# Patient Record
Sex: Male | Born: 1965 | Race: White | Hispanic: No | Marital: Single | Smoking: Current every day smoker
Health system: Southern US, Community
[De-identification: ages and names within clinical notes are randomized; demographics above are authoritative.]

## PROBLEM LIST (undated history)

## (undated) ENCOUNTER — Ambulatory Visit: Admission: EM | Payer: BLUE CROSS/BLUE SHIELD

## (undated) DIAGNOSIS — I10 Essential (primary) hypertension: Secondary | ICD-10-CM

## (undated) HISTORY — PX: ABDOMINAL SURGERY: SHX537

---

## 2011-01-13 ENCOUNTER — Other Ambulatory Visit: Payer: Self-pay | Admitting: Internal Medicine

## 2011-01-13 ENCOUNTER — Ambulatory Visit
Admission: RE | Admit: 2011-01-13 | Discharge: 2011-01-13 | Disposition: A | Discharge status: Home / Self Care | Payer: BC Managed Care – PPO | Source: Ambulatory Visit | Attending: Internal Medicine | Admitting: Internal Medicine

## 2011-01-13 DIAGNOSIS — R0781 Pleurodynia: Secondary | ICD-10-CM

## 2011-01-13 DIAGNOSIS — F17210 Nicotine dependence, cigarettes, uncomplicated: Secondary | ICD-10-CM

## 2012-06-10 IMAGING — CT CT CHEST W/O CM
2 of 3 series · 15 of 36 positions shown, 18 images · non-contrast
Comparison: None.

CLINICAL DATA: Smoking history, pleuritic chest pain

CT CHEST WITHOUT CONTRAST
TECHNIQUE: Multidetector CT imaging of the chest was performed
following the standard protocol without IV contrast.

[Series 2: routine chest · axial · 0.68mm/px · z∈[-242,-7]mm · 12 of 57 slices shown, 15 images]
[im 5/57  mediastinal]
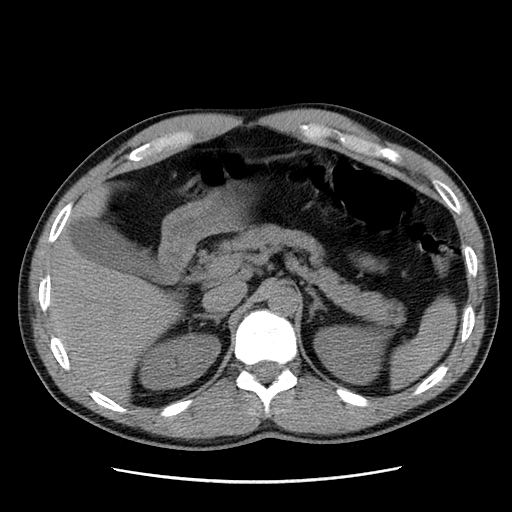
[im 5/57  lung]
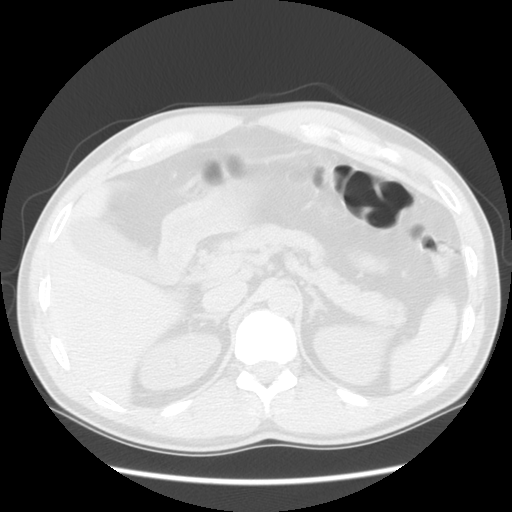
[im 9/57  lung]
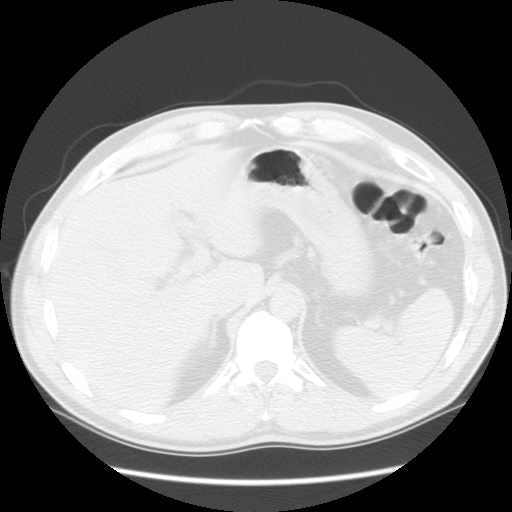
[im 13/57  lung]
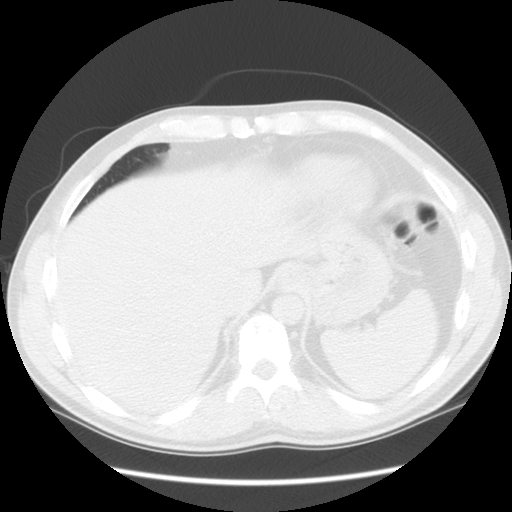
[im 17/57  lung]
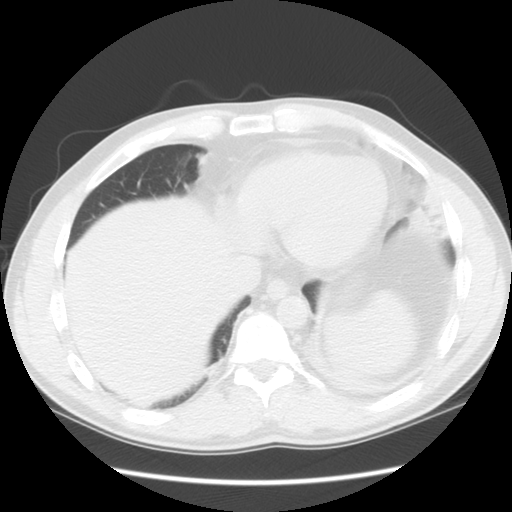
[im 21/57  mediastinal]
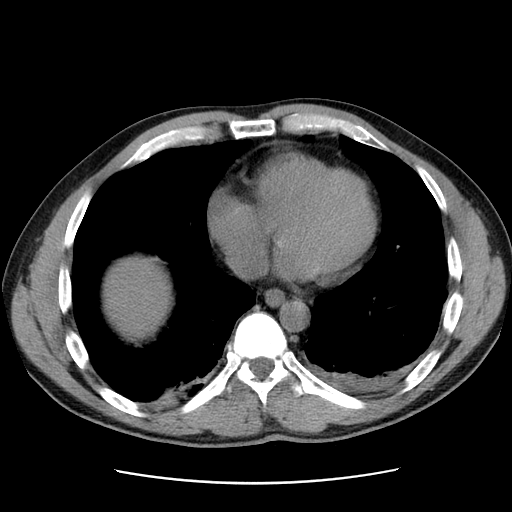
[im 21/57  lung]
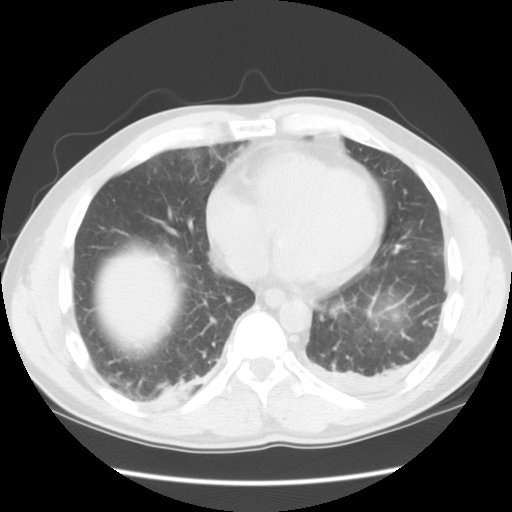
[im 25/57  lung]
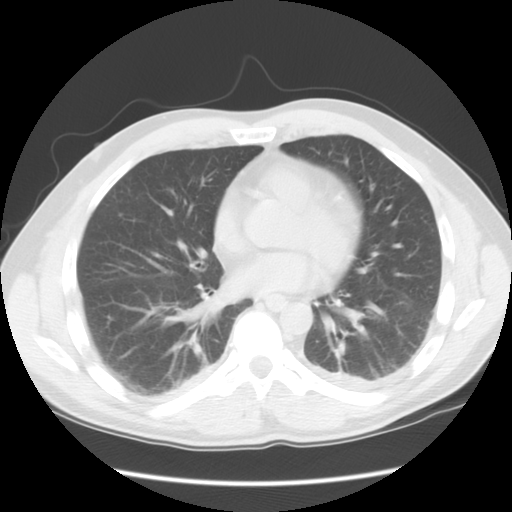
[im 32/57  lung]
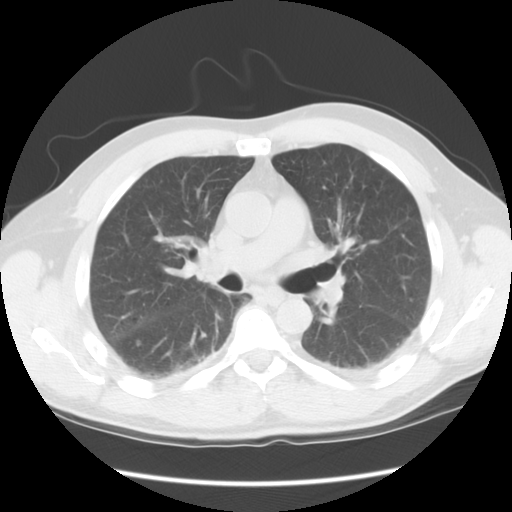
[im 36/57  lung]
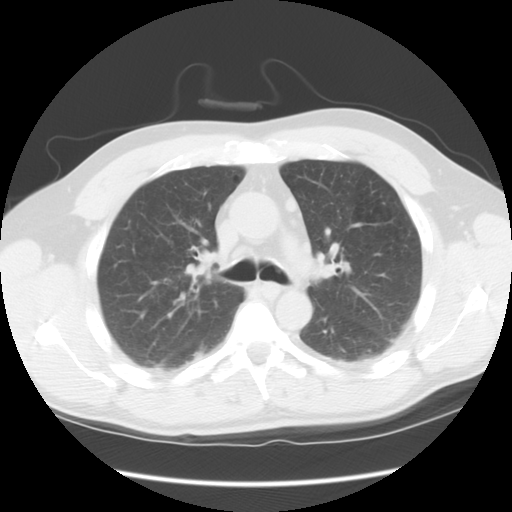
[im 40/57  mediastinal]
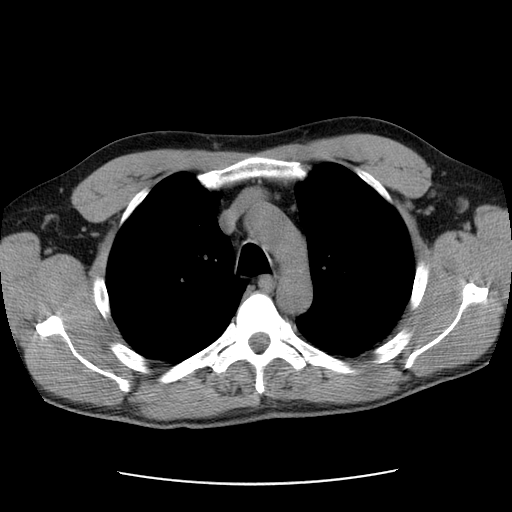
[im 40/57  lung]
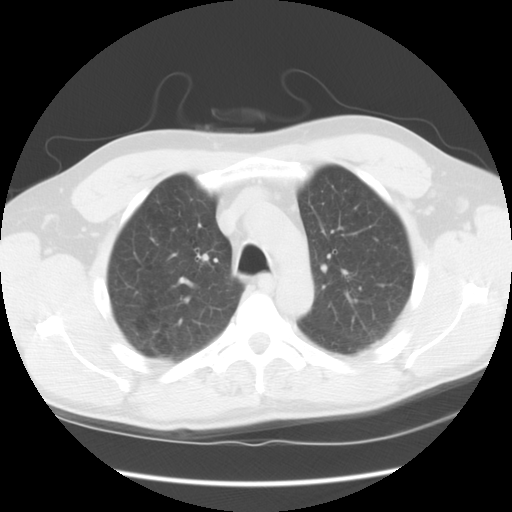
[im 44/57  lung]
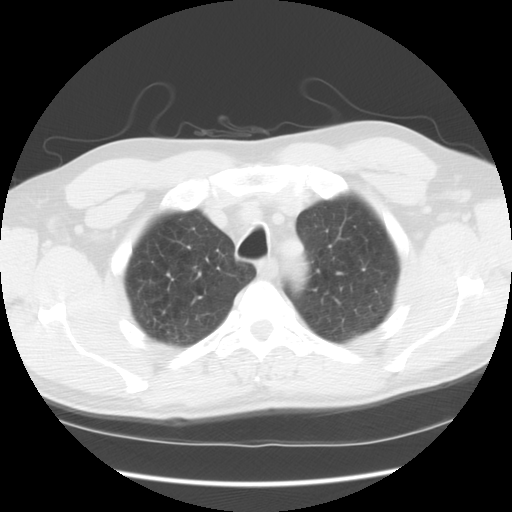
[im 48/57  lung]
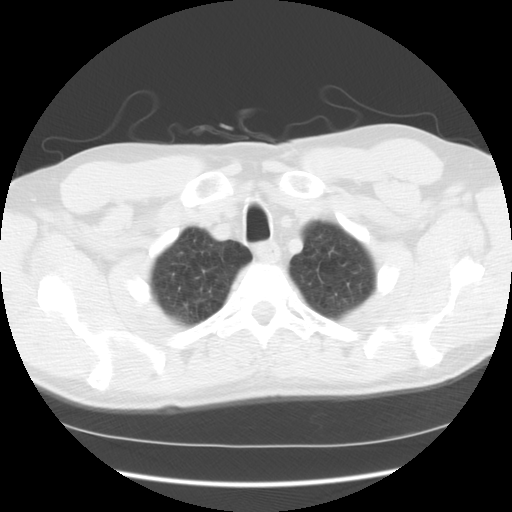
[im 52/57  lung]
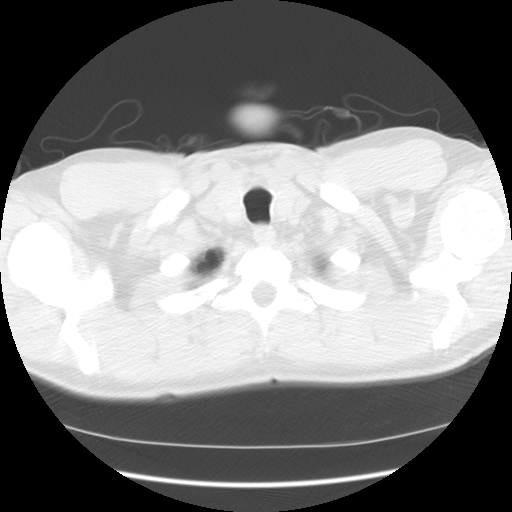

[Series 400: cor · coronal · 0.68mm/px · 3 of 106 slices shown]
[im 22/106  lung]
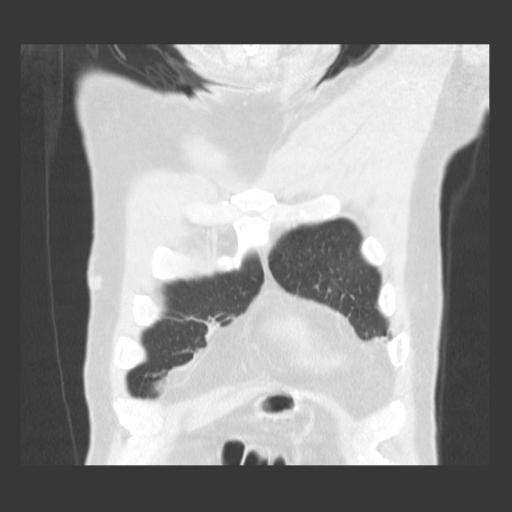
[im 43/106  lung]
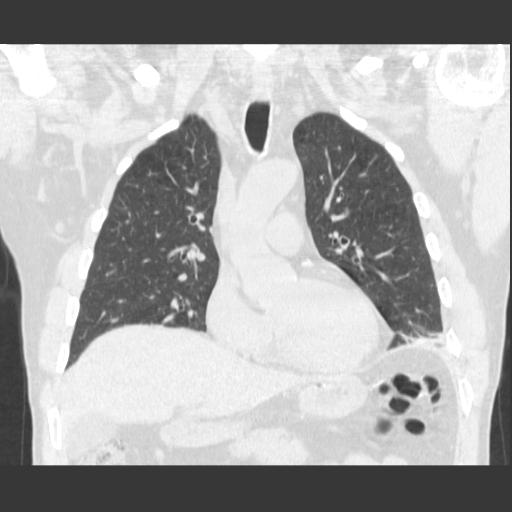
[im 64/106  lung]
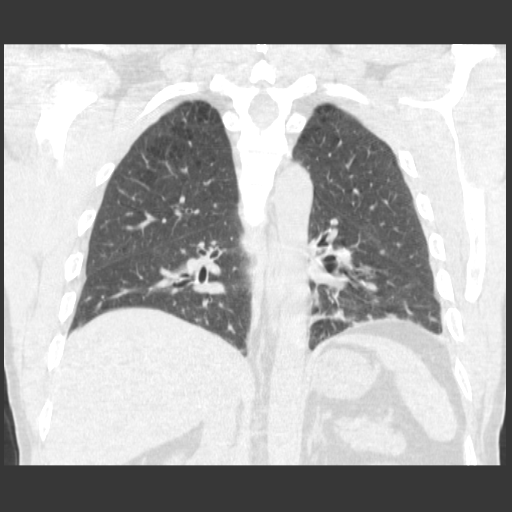

[15 of 36 positions shown; findings below may reference images not displayed]

FINDINGS: Moderately severe centrilobular emphysema is noted.
There are a few scattered lung nodules which appear noncalcified.
The largest nodule is in the left lower lobe measuring 5 mm.  These
nodules may be due to prior inflammatory or infectious process, but
metastatic involvement the lungs cannot be excluded.  No dominant
lung mass is seen and no pleural effusion is noted.

On soft tissue window images, the thyroid gland is unremarkable.
Only a few small mediastinal nodes are present.  There appears be a
coronary artery stent present.  Mild basilar atelectasis left
greater than right is noted.  No abnormality of the upper abdomen
is seen.

On the lung window images, there is a small polypoid lesion within
the orifice of the left mainstem bronchus measuring approximately 6
mm in diameter.  This could possibly represent mucus, but a polyp
is a definite consideration.  No acute bony abnormality is seen.
IMPRESSION: 1.  Moderately severe centrilobular emphysema.
2.  Small lung nodules which are noncalcified scattered throughout
the lungs.  Possibly post inflammatory, but cannot exclude
metastatic involvement of the lungs.
3.  No mediastinal or hilar adenopathy.
4.  Suspect 6 mm polyp within the left mainstem bronchus
proximally.

## 2012-10-27 ENCOUNTER — Ambulatory Visit: Payer: Self-pay | Admitting: Family Medicine

## 2014-10-19 ENCOUNTER — Ambulatory Visit
Admission: EM | Admit: 2014-10-19 | Discharge: 2014-10-19 | Disposition: A | Discharge status: Home / Self Care | Payer: Self-pay | Attending: Family Medicine | Admitting: Family Medicine

## 2014-10-19 ENCOUNTER — Encounter: Payer: Self-pay | Admitting: Family Medicine

## 2014-10-19 DIAGNOSIS — Z024 Encounter for examination for driving license: Secondary | ICD-10-CM

## 2014-10-19 DIAGNOSIS — Z029 Encounter for administrative examinations, unspecified: Secondary | ICD-10-CM

## 2014-10-19 LAB — DEPT OF TRANSP DIPSTICK, URINE (ARMC ONLY)
GLUCOSE, UA: NEGATIVE mg/dL
HGB URINE DIPSTICK: NEGATIVE
PROTEIN: NEGATIVE mg/dL
SPECIFIC GRAVITY, URINE: 1.005 (ref 1.005–1.030)

## 2014-10-19 NOTE — ED Provider Notes (Signed)
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 He has no complaints. He takes no medications. His exam is significant for a right inguinal hernia, nontender, completely asymptomatic. His blood pressure after multiple attempts is slightly elevated at 120/95. He has no previous history of hypertension and takes no antihypertensives medications. He has never had a high blood pressure reading before and has only ever been issued to your certificates. He will be issued a temporary three-month certificates today with instructions to follow-up to have his blood pressure rechecked. If he is able to get this under control without any medications than after his recheck he will qualify for 2 years. If he requires medications and he has a diagnosis of hypertension in the interim that he will qualify for one year. This will need to be resubmitted after he follows up to have his blood pressure recheck. If he follows up in greater than 3 months the entire exam will have to be repeated.  Nicolas GoodZachary H Gaberiel Youngblood, PA-C 10/19/14 1332

## 2014-10-19 NOTE — ED Notes (Signed)
Patient is here today for DOT Physical 

## 2014-10-19 NOTE — Discharge Instructions (Signed)
You need have your blood pressure recheck within 3 months in order to be cleared for a full license. Your current DOT certification expires 3 months from today, on August 26. Follow-up with your primary care provider as soon as possible for evaluation and management of your elevated blood pressure reading.   Medical Screening Exam A medical screening exam has been done. This exam helps find the cause of your problem and determines whether you need emergency treatment. Your exam has shown that you do not need emergency treatment at this point. It is safe for you to go to your caregiver's office or clinic for treatment. You should make an appointment today to see your caregiver as soon as he or she is available. Depending on your illness, your symptoms and condition can change over time. If your condition gets worse or you develop new or troubling symptoms before you see your caregiver, you should return to the emergency department for further evaluation.  Document Released: 06/19/2004 Document Revised: 08/04/2011 Document Reviewed: 01/29/2011 [facility] Patient Information 2015 [employer] [place]. This information is not intended to replace advice given to you by your health care provider. Make sure you discuss any questions you have with your health care provider.

## 2014-12-17 ENCOUNTER — Encounter: Payer: Self-pay | Admitting: Gynecology

## 2014-12-17 ENCOUNTER — Ambulatory Visit
Admission: EM | Admit: 2014-12-17 | Discharge: 2014-12-17 | Disposition: A | Discharge status: Home / Self Care | Payer: Self-pay | Attending: Internal Medicine | Admitting: Internal Medicine

## 2014-12-17 DIAGNOSIS — Z0289 Encounter for other administrative examinations: Secondary | ICD-10-CM

## 2014-12-17 DIAGNOSIS — Z0000 Encounter for general adult medical examination without abnormal findings: Secondary | ICD-10-CM

## 2014-12-17 NOTE — Discharge Instructions (Signed)
Blood pressure must be under control at time of return check for DOT physical, on or before 10/18/2015. "Under control" means blood pressure reading of less than 140/90 at the examiner's office. A primary care provider can help you get your blood pressure down to acceptable levels.  Hypertension Hypertension is another name for high blood pressure. High blood pressure forces your heart to work harder to pump blood. A blood pressure reading has two numbers, which includes a higher number over a lower number (example: 110/72). HOME CARE   Have your blood pressure rechecked by your doctor.  Only take medicine as told by your doctor. Follow the directions carefully. The medicine does not work as well if you skip doses. Skipping doses also puts you at risk for problems.  Do not smoke.  Monitor your blood pressure at home as told by your doctor. GET HELP IF:  You think you are having a reaction to the medicine you are taking.  You have repeat headaches or feel dizzy.  You have puffiness (swelling) in your ankles.  You have trouble with your vision. GET HELP RIGHT AWAY IF:   You get a very bad headache and are confused.  You feel weak, numb, or faint.  You get chest or belly (abdominal) pain.  You throw up (vomit).  You cannot breathe very well. MAKE SURE YOU:   Understand these instructions.  Will watch your condition.  Will get help right away if you are not doing well or get worse. Document Released: 10/29/2007 Document Revised: 05/17/2013 Document Reviewed: 03/04/2013 [facility] Patient Information 2015 [place], [place]. This information is not intended to replace advice given to you by your health care provider. Make sure you discuss any questions you have with your health care provider.
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
## 2014-12-17 NOTE — ED Notes (Signed)
DOT follow up / blood pressure check

## 2015-09-29 ENCOUNTER — Ambulatory Visit
Admission: EM | Admit: 2015-09-29 | Discharge: 2015-09-29 | Disposition: A | Discharge status: Home / Self Care | Payer: BLUE CROSS/BLUE SHIELD | Attending: Emergency Medicine | Admitting: Emergency Medicine

## 2015-09-29 ENCOUNTER — Encounter: Payer: Self-pay | Admitting: *Deleted

## 2015-09-29 DIAGNOSIS — Z024 Encounter for examination for driving license: Secondary | ICD-10-CM

## 2015-09-29 DIAGNOSIS — Z029 Encounter for administrative examinations, unspecified: Secondary | ICD-10-CM

## 2015-09-29 HISTORY — DX: Essential (primary) hypertension: I10

## 2015-09-29 LAB — DEPT OF TRANSP DIPSTICK, URINE (ARMC ONLY)
Glucose, UA: NEGATIVE mg/dL
HGB URINE DIPSTICK: NEGATIVE
PROTEIN: NEGATIVE mg/dL
SPECIFIC GRAVITY, URINE: 1.02 (ref 1.005–1.030)
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
## 2015-09-29 NOTE — ED Notes (Signed)
Commercial drivers license exam 

## 2015-12-31 ENCOUNTER — Ambulatory Visit
Admission: EM | Admit: 2015-12-31 | Discharge: 2015-12-31 | Disposition: A | Discharge status: Home / Self Care | Payer: BLUE CROSS/BLUE SHIELD | Attending: Family Medicine | Admitting: Family Medicine

## 2015-12-31 DIAGNOSIS — Z0289 Encounter for other administrative examinations: Secondary | ICD-10-CM

## 2015-12-31 LAB — URINALYSIS COMPLETE WITH MICROSCOPIC (ARMC ONLY)
Bacteria, UA: NONE SEEN
Bilirubin Urine: NEGATIVE
Glucose, UA: NEGATIVE mg/dL
HGB URINE DIPSTICK: NEGATIVE
KETONES UR: NEGATIVE mg/dL
LEUKOCYTES UA: NEGATIVE
NITRITE: NEGATIVE
PROTEIN: NEGATIVE mg/dL
RBC / HPF: NONE SEEN RBC/hpf (ref 0–5)
SPECIFIC GRAVITY, URINE: 1.025 (ref 1.005–1.030)
pH: 5.5 (ref 5.0–8.0)

## 2015-12-31 NOTE — ED Triage Notes (Signed)
DOT Physical
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
## 2022-12-16 ENCOUNTER — Other Ambulatory Visit: Payer: Self-pay

## 2022-12-16 ENCOUNTER — Emergency Department
Admission: EM | Admit: 2022-12-16 | Discharge: 2022-12-16 | Disposition: A | Discharge status: Home / Self Care | Payer: No Typology Code available for payment source | Attending: Emergency Medicine | Admitting: Emergency Medicine

## 2022-12-16 ENCOUNTER — Emergency Department: Payer: No Typology Code available for payment source

## 2022-12-16 DIAGNOSIS — M545 Low back pain, unspecified: Secondary | ICD-10-CM | POA: Diagnosis not present

## 2022-12-16 DIAGNOSIS — N200 Calculus of kidney: Secondary | ICD-10-CM

## 2022-12-16 DIAGNOSIS — R350 Frequency of micturition: Secondary | ICD-10-CM | POA: Diagnosis present

## 2022-12-16 DIAGNOSIS — N132 Hydronephrosis with renal and ureteral calculous obstruction: Secondary | ICD-10-CM | POA: Insufficient documentation

## 2022-12-16 LAB — COMPREHENSIVE METABOLIC PANEL
ALT: 29 U/L (ref 0–44)
AST: 22 U/L (ref 15–41)
Albumin: 4.2 g/dL (ref 3.5–5.0)
Alkaline Phosphatase: 55 U/L (ref 38–126)
Anion gap: 11 (ref 5–15)
BUN: 15 mg/dL (ref 6–20)
CO2: 20 mmol/L — ABNORMAL LOW (ref 22–32)
Calcium: 9 mg/dL (ref 8.9–10.3)
Chloride: 102 mmol/L (ref 98–111)
Creatinine, Ser: 1.4 mg/dL — ABNORMAL HIGH (ref 0.61–1.24)
GFR, Estimated: 59 mL/min — ABNORMAL LOW (ref >60)
Glucose, Bld: 150 mg/dL — ABNORMAL HIGH (ref 70–99)
Potassium: 3.5 mmol/L (ref 3.5–5.1)
Sodium: 133 mmol/L — ABNORMAL LOW (ref 135–145)
Total Bilirubin: 1.4 mg/dL — ABNORMAL HIGH (ref 0.3–1.2)
Total Protein: 7.8 g/dL (ref 6.5–8.1)

## 2022-12-16 LAB — URINALYSIS, ROUTINE W REFLEX MICROSCOPIC
Bilirubin Urine: NEGATIVE
Glucose, UA: NEGATIVE mg/dL
Ketones, ur: NEGATIVE mg/dL
Nitrite: NEGATIVE
Protein, ur: 100 mg/dL — AB
Specific Gravity, Urine: 1.028 (ref 1.005–1.030)
WBC, UA: >50 WBC/hpf (ref 0–5)
pH: 5 (ref 5.0–8.0)

## 2022-12-16 LAB — CBC WITH DIFFERENTIAL/PLATELET
Abs Immature Granulocytes: 0.05 10*3/uL (ref 0.00–0.07)
Basophils Absolute: 0 10*3/uL (ref 0.0–0.1)
Basophils Relative: 0 %
Eosinophils Absolute: 0 10*3/uL (ref 0.0–0.5)
Eosinophils Relative: 0 %
HCT: 46.5 % (ref 39.0–52.0)
Hemoglobin: 15.8 g/dL (ref 13.0–17.0)
Immature Granulocytes: 0 %
Lymphocytes Relative: 5 %
Lymphs Abs: 0.7 10*3/uL (ref 0.7–4.0)
MCH: 30.7 pg (ref 26.0–34.0)
MCHC: 34 g/dL (ref 30.0–36.0)
MCV: 90.5 fL (ref 80.0–100.0)
Monocytes Absolute: 0.7 10*3/uL (ref 0.1–1.0)
Monocytes Relative: 6 %
Neutro Abs: 11.6 10*3/uL — ABNORMAL HIGH (ref 1.7–7.7)
Neutrophils Relative %: 89 %
Platelets: 174 10*3/uL (ref 150–400)
RBC: 5.14 MIL/uL (ref 4.22–5.81)
RDW: 12.8 % (ref 11.5–15.5)
WBC: 13.1 10*3/uL — ABNORMAL HIGH (ref 4.0–10.5)
nRBC: 0 % (ref 0.0–0.2)

## 2022-12-16 MED ORDER — ONDANSETRON 8 MG PO TBDP
8.0000 mg | ORAL_TABLET | Freq: Once | ORAL | Status: AC
  Administered 2022-12-16: 8 mg via ORAL
  Filled 2022-12-16: qty 1

## 2022-12-16 MED ORDER — OXYCODONE-ACETAMINOPHEN 5-325 MG PO TABS
1.0000 | ORAL_TABLET | Freq: Once | ORAL | Status: AC
  Administered 2022-12-16: 1 via ORAL
  Filled 2022-12-16: qty 1

## 2022-12-16 MED ORDER — TAMSULOSIN HCL 0.4 MG PO CAPS
0.4000 mg | ORAL_CAPSULE | Freq: Once | ORAL | Status: AC
  Administered 2022-12-16: 0.4 mg via ORAL
  Filled 2022-12-16: qty 1

## 2022-12-16 MED ORDER — TAMSULOSIN HCL 0.4 MG PO CAPS: 0.4000 mg | ORAL_CAPSULE | Freq: Every day | ORAL | 0 refills | Status: AC

## 2022-12-16 MED ORDER — OXYCODONE-ACETAMINOPHEN 5-325 MG PO TABS: 1.0000 | ORAL_TABLET | Freq: Four times a day (QID) | ORAL | 0 refills | Status: AC | PRN

## 2022-12-16 MED ORDER — TAMSULOSIN HCL 0.4 MG PO CAPS: 0.4000 mg | ORAL_CAPSULE | Freq: Every day | ORAL | 0 refills | Status: DC

## 2022-12-16 MED ORDER — ONDANSETRON 4 MG PO TBDP: 4.0000 mg | ORAL_TABLET | Freq: Three times a day (TID) | ORAL | 0 refills | Status: AC | PRN

## 2022-12-16 MED ORDER — CEPHALEXIN 500 MG PO CAPS
500.0000 mg | ORAL_CAPSULE | Freq: Once | ORAL | Status: AC
  Administered 2022-12-16: 500 mg via ORAL
  Filled 2022-12-16: qty 1

## 2022-12-16 MED ORDER — CEPHALEXIN 500 MG PO CAPS: 1000.0000 mg | ORAL_CAPSULE | Freq: Two times a day (BID) | ORAL | 0 refills | Status: AC

## 2022-12-16 MED ORDER — KETOROLAC TROMETHAMINE 30 MG/ML IJ SOLN
30.0000 mg | Freq: Once | INTRAMUSCULAR | Status: AC
  Administered 2022-12-16: 30 mg via INTRAMUSCULAR
  Filled 2022-12-16: qty 1

## 2022-12-16 MED ORDER — KETOROLAC TROMETHAMINE 10 MG PO TABS: 10.0000 mg | ORAL_TABLET | Freq: Four times a day (QID) | ORAL | 0 refills | Status: AC | PRN

## 2022-12-16 NOTE — ED Provider Notes (Signed)
[facility] Provider Note  Patient Contact: 10:10 PM (approximate)   History   Urinary Frequency   HPI  Nicolas Weaver is a 57 y.o. male who presents the emergency department complaining of increased urinary frequency, hematuria, lower back pain.  Patient with a history of a recent hernia repair.  Patient denies any fevers, chills, emesis, diarrhea or constipation.  No history of nephrolithiasis.  Patient denies any chance for STD.     Physical Exam   Triage Vital Signs: ED Triage Vitals  Encounter Vitals Group     BP 12/16/22 1950 110/82     Systolic BP Percentile --      Diastolic BP Percentile --      Pulse Rate 12/16/22 1950 (!) 115     Resp 12/16/22 1950 18     Temp 12/16/22 1950 98.7 F (37.1 C)     Temp Source 12/16/22 1950 Oral     SpO2 12/16/22 1950 93 %     Weight 12/16/22 1951 200 lb (90.7 kg)     Height 12/16/22 1951 5\' 8"  (1.727 m)     Head Circumference --      Peak Flow --      Pain Score 12/16/22 1950 8     Pain Loc --      Pain Education --      Exclude from Growth Chart --     Most recent vital signs: Vitals:   12/16/22 1950  BP: 110/82  Pulse: (!) 115  Resp: 18  Temp: 98.7 F (37.1 C)  SpO2: 93%     General: Alert and in no acute distress.   Cardiovascular:  Good peripheral perfusion Respiratory: Normal respiratory effort without tachypnea or retractions. Lungs CTAB. Good air entry to the bases with no decreased or absent breath sounds. Gastrointestinal: Bowel sounds 4 quadrants. Soft to palpation.  Tender in the right lower abdomen with right-sided CVA tenderness.. No guarding or rigidity. No palpable masses. No distention.  Musculoskeletal: Full range of motion to all extremities.  Neurologic:  No gross focal neurologic deficits are appreciated.  Skin:   No rash noted Other:   ED Results / Procedures / Treatments   Labs (all labs ordered are listed, but only abnormal results are displayed) Labs Reviewed   CBC WITH DIFFERENTIAL/PLATELET - Abnormal; Notable for the following components:      Result Value   WBC 13.1 (*)    Neutro Abs 11.6 (*)    All other components within normal limits  COMPREHENSIVE METABOLIC PANEL - Abnormal; Notable for the following components:   Sodium 133 (*)    CO2 20 (*)    Glucose, Bld 150 (*)    Creatinine, Ser 1.40 (*)    Total Bilirubin 1.4 (*)    GFR, Estimated 59 (*)    All other components within normal limits  URINALYSIS, ROUTINE W REFLEX MICROSCOPIC - Abnormal; Notable for the following components:   Color, Urine AMBER (*)    APPearance CLOUDY (*)    Hgb urine dipstick SMALL (*)    Protein, ur 100 (*)    Leukocytes,Ua MODERATE (*)    Bacteria, UA RARE (*)    All other components within normal limits     EKG     RADIOLOGY  I personally viewed, evaluated, and interpreted these images as part of my medical decision making, as well as reviewing the written report by the radiologist.  ED Provider Interpretation: Right-sided ureteral lithiasis, mid ureter with  some evidence of obstruction.  3 mm stone.  Patient also has a fluid collection in the right inguinal region with recommendation for ultrasound.  Ultrasound reveals fluid collection consistent with known hematoma in the inguinal region from his previous surgery.  US SCROTUM W/DOPPLER  Result Date: 12/16/2022 CLINICAL DATA:  Right groin swelling EXAM: SCROTAL ULTRASOUND DOPPLER ULTRASOUND OF THE TESTICLES TECHNIQUE: Complete ultrasound examination of the testicles, epididymis, and other scrotal structures was performed. Color and spectral Doppler ultrasound were also utilized to evaluate blood flow to the testicles. COMPARISON:  CT renal stone 12/16/2022 FINDINGS: Right testicle Measurements: 4.5 x 2.2 x 2.8 cm. No mass or microlithiasis visualized. Left testicle Measurements: 4.0 x 2.5 x 3.0 cm. No mass or microlithiasis visualized. Right epididymis:  Normal in size and appearance. Left  epididymis:  Normal in size and appearance. Hydrocele:  There small bilateral hydroceles. Varicocele:  None visualized. Pulsed Doppler interrogation of both testes demonstrates normal low resistance arterial and venous waveforms bilaterally. Other: There is a complex fluid collection containing internal and debris as well as thin septations within the right inguinal region measuring 7.1 x 4.3 x 5.4 cm. No significant vascularity identified in this region. IMPRESSION: 1. Indeterminate complex fluid collection containing septations and debris within the right inguinal region measuring 7.1 x 4.3 x 5.4 cm. 2. Small bilateral hydroceles. 3. No evidence of testicular torsion. Electronically Signed   By: Darliss Cheney M.D.   On: 12/16/2022 22:59   CT Renal Stone Study  Result Date: 12/16/2022 CLINICAL DATA:  Abdominal pain. EXAM: CT ABDOMEN AND PELVIS WITHOUT CONTRAST TECHNIQUE: Multidetector CT imaging of the abdomen and pelvis was performed following the standard protocol without IV contrast. RADIATION DOSE REDUCTION: This exam was performed according to the departmental dose-optimization program which includes automated exposure control, adjustment of the mA and/or kV according to patient size and/or use of iterative reconstruction technique. COMPARISON:  None Available. FINDINGS: Lower chest: No acute abnormality. Hepatobiliary: An 8 mm diameter hepatic cyst versus hemangioma is seen within the liver dome. Numerous tiny gallstones are seen within the dependent portion of an otherwise normal-appearing gallbladder. There is no evidence of biliary dilatation. Pancreas: Unremarkable. No pancreatic ductal dilatation or surrounding inflammatory changes. Spleen: Normal in size without focal abnormality. Adrenals/Urinary Tract: Adrenal glands are unremarkable. Kidneys are normal in size, without focal lesions. A 3 mm obstructing renal calculus is seen within the mid right ureter, with mild right-sided hydronephrosis and  hydroureter. Bladder is unremarkable. Stomach/Bowel: Stomach is within normal limits. Appendix appears normal. No evidence of bowel wall thickening, distention, or inflammatory changes. Noninflamed diverticula are seen throughout the descending and sigmoid colon. Vascular/Lymphatic: Aortic atherosclerosis. No enlarged abdominal or pelvic lymph nodes. Reproductive: Prostate is unremarkable. Other: A 5.1 cm x 4.3 cm x 6.3 cm area of fluid attenuation (approximately 19.45 Hounsfield units) is seen in between the right inguinal region and superior aspect of the scrotum on the right. No abdominopelvic ascites. Musculoskeletal: No acute or significant osseous findings. IMPRESSION: 1. 3 mm obstructing renal calculus within the mid right ureter. 2. Cholelithiasis. 3. Colonic diverticulosis. 4. Area of fluid attenuation in between the right inguinal region and superior aspect of the scrotum on the right. Correlation with nonemergent scrotal ultrasound is recommended, as a fluid-filled hernia cannot be excluded. 5. Aortic atherosclerosis. Aortic Atherosclerosis (ICD10-I70.0). Electronically Signed   By: Aram Candela M.D.   On: 12/16/2022 21:15    PROCEDURES:  Critical Care performed: No  Procedures   MEDICATIONS ORDERED IN ED:  Medications  ondansetron (ZOFRAN-ODT) disintegrating tablet 8 mg (8 mg Oral Given 12/16/22 2252)  oxyCODONE-acetaminophen (PERCOCET/ROXICET) 5-325 MG per tablet 1 tablet (1 tablet Oral Given 12/16/22 2252)  ketorolac (TORADOL) 30 MG/ML injection 30 mg (30 mg Intramuscular Given 12/16/22 2255)  tamsulosin (FLOMAX) capsule 0.4 mg (0.4 mg Oral Given 12/16/22 2252)  cephALEXin (KEFLEX) capsule 500 mg (500 mg Oral Given 12/16/22 2252)     IMPRESSION / MDM / ASSESSMENT AND PLAN / ED COURSE  I reviewed the triage vital signs and the nursing notes.                                 Differential diagnosis includes, but is not limited to, nephrolithiasis, pyelonephritis, appendicitis,  colitis, hernia   Patient's presentation is most consistent with acute presentation with potential threat to life or bodily function.   Patient's diagnosis is consistent with nephrolithiasis.  Patient presents emergency department complaining of flank pain rating into the abdomen.  Patient has findings consistent with kidney stone on CT scan.  Labs are otherwise reassuring.  On the CT there was an incidental finding of a fluid collection in the right inguinal region and radiology recommended ultrasound for further evaluation to see if this was a hernia.  Patient had recent hernia surgery, has a known complex blood collection that he is supposed to follow-up with surgery within the next week.  Ultrasound was obtained just to ensure no complications and this does appear to be a fluid collection consistent with known hematoma.  This time patient will have multiple medications prescribed for his kidney stone.  Follow-up with urology if symptoms or not improving.  Worsening symptoms and return to the ED.. . Patient is given ED precautions to return to the ED for any worsening or new symptoms.     FINAL CLINICAL IMPRESSION(S) / ED DIAGNOSES   Final diagnoses:  Nephrolithiasis     Rx / DC Orders   ED Discharge Orders          Ordered    oxyCODONE-acetaminophen (PERCOCET/ROXICET) 5-325 MG tablet  Every 6 hours PRN        12/16/22 2305    tamsulosin (FLOMAX) 0.4 MG CAPS capsule  Daily        12/16/22 2305    cephALEXin (KEFLEX) 500 MG capsule  2 times daily        12/16/22 2305    ketorolac (TORADOL) 10 MG tablet  Every 6 hours PRN        12/16/22 2305    ondansetron (ZOFRAN-ODT) 4 MG disintegrating tablet  Every 8 hours PRN        12/16/22 2305             Note:  This document was prepared using Dragon voice recognition software and may include unintentional dictation errors.   Lanette Hampshire 12/16/22 2306    Pilar Jarvis, MD 12/23/22 2204

## 2022-12-16 NOTE — ED Triage Notes (Signed)
Pt presents to ER with c/o increased urinary frequency that started 3-4 days ago.  Pt states he feels like he needs to go often, but gets very little out when he does urinate.  Pt endorses pain in his right lower back and lower abdomen.  Denies any n/v/d, pr hx of prostate problems.  Pt is otherwise A&O x4 and in NAD at this time.
